# Patient Record
Sex: Male | Born: 1965 | Race: White | Hispanic: No | Marital: Single | Smoking: Former smoker
Health system: Southern US, Community
[De-identification: ages and names within clinical notes are randomized; demographics above are authoritative.]

## PROBLEM LIST (undated history)

## (undated) DIAGNOSIS — M51369 Other intervertebral disc degeneration, lumbar region without mention of lumbar back pain or lower extremity pain: Secondary | ICD-10-CM

## (undated) DIAGNOSIS — M5136 Other intervertebral disc degeneration, lumbar region: Secondary | ICD-10-CM

## (undated) DIAGNOSIS — M25512 Pain in left shoulder: Secondary | ICD-10-CM

## (undated) HISTORY — PX: TONSILLECTOMY: SUR1361

---

## 2014-07-28 ENCOUNTER — Ambulatory Visit: Payer: Self-pay | Admitting: General Practice

## 2016-02-13 ENCOUNTER — Emergency Department
Admission: EM | Admit: 2016-02-13 | Discharge: 2016-02-13 | Disposition: A | Discharge status: Home / Self Care | Payer: BLUE CROSS/BLUE SHIELD | Attending: Student | Admitting: Student

## 2016-02-13 ENCOUNTER — Emergency Department: Payer: BLUE CROSS/BLUE SHIELD

## 2016-02-13 DIAGNOSIS — M25512 Pain in left shoulder: Secondary | ICD-10-CM | POA: Diagnosis not present

## 2016-02-13 DIAGNOSIS — R079 Chest pain, unspecified: Secondary | ICD-10-CM | POA: Diagnosis present

## 2016-02-13 DIAGNOSIS — M25511 Pain in right shoulder: Secondary | ICD-10-CM | POA: Diagnosis not present

## 2016-02-13 LAB — COMPREHENSIVE METABOLIC PANEL
ALBUMIN: 4.4 g/dL (ref 3.5–5.0)
ALT: 44 U/L (ref 17–63)
AST: 34 U/L (ref 15–41)
Alkaline Phosphatase: 67 U/L (ref 38–126)
Anion gap: 6 (ref 5–15)
BILIRUBIN TOTAL: 0.7 mg/dL (ref 0.3–1.2)
BUN: 12 mg/dL (ref 6–20)
CO2: 29 mmol/L (ref 22–32)
CREATININE: 0.96 mg/dL (ref 0.61–1.24)
Calcium: 9.2 mg/dL (ref 8.9–10.3)
Chloride: 105 mmol/L (ref 101–111)
GFR calc Af Amer: >60 mL/min (ref >60)
GLUCOSE: 166 mg/dL — AB (ref 65–99)
POTASSIUM: 3.8 mmol/L (ref 3.5–5.1)
Sodium: 140 mmol/L (ref 135–145)
TOTAL PROTEIN: 7.7 g/dL (ref 6.5–8.1)

## 2016-02-13 LAB — CBC
HEMATOCRIT: 45.2 % (ref 40.0–52.0)
HEMOGLOBIN: 15.8 g/dL (ref 13.0–18.0)
MCH: 31.2 pg (ref 26.0–34.0)
MCHC: 34.9 g/dL (ref 32.0–36.0)
MCV: 89.5 fL (ref 80.0–100.0)
Platelets: 254 10*3/uL (ref 150–440)
RBC: 5.05 MIL/uL (ref 4.40–5.90)
RDW: 13.4 % (ref 11.5–14.5)
WBC: 8.3 10*3/uL (ref 3.8–10.6)

## 2016-02-13 LAB — TROPONIN I

## 2016-02-13 MED ORDER — ASPIRIN 81 MG PO CHEW
324.0000 mg | CHEWABLE_TABLET | Freq: Once | ORAL | Status: AC
  Administered 2016-02-13: 324 mg via ORAL
  Filled 2016-02-13: qty 4

## 2016-02-13 MED ORDER — IBUPROFEN 600 MG PO TABS: 600.0000 mg | ORAL_TABLET | Freq: Four times a day (QID) | ORAL | Status: DC | PRN

## 2016-02-13 NOTE — ED Notes (Signed)
Pt arrived to ED with c/o CP that is mid sternal that is described as burning. Pt also reports pain radiates into left arm. Pt reports left arm is tingling. Pt reports "burping makes it feel better".

## 2016-02-13 NOTE — ED Provider Notes (Signed)
[facility] Emergency Department Provider Note  ____________________________________________  Time seen: Approximately 9:16 PM  I have reviewed the triage vital signs and the nursing notes.   HISTORY  Chief Complaint Chest Pain    HPI Raymond Glenn is a 50 y.o. male with no chronic medical problems who presents with constant burning sternal chest pain today since 3 pm, gradual onset, constant since onset, moderate and worse with movement. Pain began 30 mins after eating pizza. No nausea, vomiting, diarrhea, fevers, or chills. Pain not pleuritic in nature. No history of coronary artery disease. He is also having pain in bilateral shoulders but reports that he moved a heavy tree 3 days ago. No personal or family history of PE or DVT, no exogenous estrogen, hemoptysis, recent surgeries. He reports that his father had a heart attack in his 106s.   History reviewed. No pertinent past medical history.  There are no active problems to display for this patient.   History reviewed. No pertinent past surgical history.  Current Outpatient Rx  Name  Route  Sig  Dispense  Refill  . ibuprofen (ADVIL,MOTRIN) 600 MG tablet   Oral   Take 1 tablet (600 mg total) by mouth every 6 (six) hours as needed for moderate pain.   15 tablet   0     Allergies Sulfa antibiotics  History reviewed. No pertinent family history.  Social History Social History  Substance Use Topics  . Smoking status: Never Smoker   . Smokeless tobacco: None  . Alcohol Use: No    Review of Systems Constitutional: No fever/chills Eyes: No visual changes. ENT: No sore throat. Cardiovascular:chest pain. Respiratory: Denies shortness of breath. Gastrointestinal: No abdominal pain.  No nausea, no vomiting.  No diarrhea.  No constipation. Genitourinary: Negative for dysuria. Musculoskeletal: Negative for back pain. Skin: Negative for rash. Neurological: Negative for headaches, focal weakness or  numbness.  10-point ROS otherwise negative.  ____________________________________________   PHYSICAL EXAM:  VITAL SIGNS: ED Triage Vitals  Enc Vitals Group     BP 02/13/16 2030 142/87 mmHg     Pulse Rate 02/13/16 2030 91     Resp 02/13/16 2030 20     Temp 02/13/16 2030 98.2 F (36.8 C)     Temp Source 02/13/16 2030 Oral     SpO2 02/13/16 2030 98 %     Weight 02/13/16 2030 224 lb (101.606 kg)     Height 02/13/16 2030  (1.905 m)     Head Cir --      Peak Flow --      Pain Score 02/13/16 2038 4     Pain Loc --      Pain Edu? --      Excl. in GC? --     Constitutional: Alert and oriented. Well appearing and in no acute distress. Eyes: Conjunctivae are normal. PERRL. EOMI. Head: Atraumatic. Nose: No congestion/rhinnorhea. Mouth/Throat: Mucous membranes are moist.  Oropharynx non-erythematous. Neck: No stridor.   Cardiovascular: Normal rate, regular rhythm. Grossly normal heart sounds.  Good peripheral circulation. Respiratory: Normal respiratory effort.  No retractions. Lungs CTAB. Gastrointestinal: Soft and nontender. No distention.  No CVA tenderness. Genitourinary: deferred Musculoskeletal: No lower extremity tenderness nor edema.  No joint effusions. Gaze would've the right pectoralis muscles worsens the patient's pain, rotation of the torso to the left also worsens his pain. Neurologic:  Normal speech and language. No gross focal neurologic deficits are appreciated. No gait instability. Skin:  Skin is warm, dry and  intact. No rash noted. Psychiatric: Mood and affect are normal. Speech and behavior are normal.  ____________________________________________   LABS (all labs ordered are listed, but only abnormal results are displayed)  Labs Reviewed  COMPREHENSIVE METABOLIC PANEL - Abnormal; Notable for the following:    Glucose, Bld 166 (*)    All other components within normal limits  CBC  TROPONIN I    ____________________________________________  EKG  ED ECG REPORT I, Gayla Doss, the attending physician, personally viewed and interpreted this ECG.   Date: 02/13/2016  EKG Time: 20:28  Rate: 90  Rhythm: normal sinus rhythm  Axis: normal  Intervals:none  ST&T Change: No acute ST elevation or ST depression.  ____________________________________________  RADIOLOGY  CXR IMPRESSION: No active cardiopulmonary disease.  ____________________________________________   PROCEDURES  Procedure(s) performed: None  Critical Care performed: No  ____________________________________________   INITIAL IMPRESSION / ASSESSMENT AND PLAN / ED COURSE  Pertinent labs & imaging results that were available during my care of the patient were reviewed by me and considered in my medical decision making (see chart for details).  Raymond Glenn is a 50 y.o. male with no chronic medical problems who presents with constant burning sternal chest pain today since 3 pm. On exam, he is very well-appearing and in no acute distress. Vital signs are stable, he is afebrile. EKG reassuring, troponin negative. Heart score 3, low risk for ACS. PERC negative and I doubt PE. Not consistent with acute aortic dissection. Chest x-ray clear. CBC and CMP unremarkable. Suspect muscular skeletal chest pain given that it worsens significantly with movement. Additionally he believes he may be having some symptoms of GERD and I suspect this may be likely as his symptoms started after eating pizza today. We discussed return precautions, treatment with ibuprofen, need for close PCP follow-up and he is comfortable with the discharge plan. DC home. Aspirin given. ____________________________________________   FINAL CLINICAL IMPRESSION(S) / ED DIAGNOSES  Final diagnoses:  Chest pain, unspecified chest pain type      Gayla Doss, MD 02/13/16 2313

## 2016-07-10 ENCOUNTER — Other Ambulatory Visit: Payer: Self-pay | Admitting: Surgery

## 2016-07-10 DIAGNOSIS — M25562 Pain in left knee: Secondary | ICD-10-CM

## 2016-07-10 DIAGNOSIS — S83232A Complex tear of medial meniscus, current injury, left knee, initial encounter: Secondary | ICD-10-CM

## 2016-07-30 ENCOUNTER — Ambulatory Visit
Admission: RE | Admit: 2016-07-30 | Discharge: 2016-07-30 | Disposition: A | Discharge status: Home / Self Care | Payer: BLUE CROSS/BLUE SHIELD | Source: Ambulatory Visit | Attending: Surgery | Admitting: Surgery

## 2016-07-30 DIAGNOSIS — M23222 Derangement of posterior horn of medial meniscus due to old tear or injury, left knee: Secondary | ICD-10-CM | POA: Insufficient documentation

## 2016-07-30 DIAGNOSIS — S83232A Complex tear of medial meniscus, current injury, left knee, initial encounter: Secondary | ICD-10-CM

## 2016-07-30 DIAGNOSIS — M25562 Pain in left knee: Secondary | ICD-10-CM

## 2016-08-15 ENCOUNTER — Encounter: Payer: Self-pay | Admitting: *Deleted

## 2016-08-21 ENCOUNTER — Ambulatory Visit: Payer: BLUE CROSS/BLUE SHIELD | Admitting: Anesthesiology

## 2016-08-21 ENCOUNTER — Ambulatory Visit
Admission: RE | Admit: 2016-08-21 | Discharge: 2016-08-21 | Disposition: A | Discharge status: Home / Self Care | Payer: BLUE CROSS/BLUE SHIELD | Source: Ambulatory Visit | Attending: Surgery | Admitting: Surgery

## 2016-08-21 ENCOUNTER — Encounter
Admission: RE | Disposition: A | Discharge status: Home / Self Care | Payer: Self-pay | Source: Ambulatory Visit | Attending: Surgery

## 2016-08-21 DIAGNOSIS — Z87891 Personal history of nicotine dependence: Secondary | ICD-10-CM | POA: Diagnosis not present

## 2016-08-21 DIAGNOSIS — X501XXA Overexertion from prolonged static or awkward postures, initial encounter: Secondary | ICD-10-CM | POA: Insufficient documentation

## 2016-08-21 DIAGNOSIS — K219 Gastro-esophageal reflux disease without esophagitis: Secondary | ICD-10-CM | POA: Insufficient documentation

## 2016-08-21 DIAGNOSIS — S83241A Other tear of medial meniscus, current injury, right knee, initial encounter: Secondary | ICD-10-CM | POA: Insufficient documentation

## 2016-08-21 DIAGNOSIS — S83242A Other tear of medial meniscus, current injury, left knee, initial encounter: Secondary | ICD-10-CM | POA: Diagnosis present

## 2016-08-21 HISTORY — DX: Other intervertebral disc degeneration, lumbar region without mention of lumbar back pain or lower extremity pain: M51.369

## 2016-08-21 HISTORY — DX: Other intervertebral disc degeneration, lumbar region: M51.36

## 2016-08-21 HISTORY — DX: Pain in left shoulder: M25.512

## 2016-08-21 HISTORY — PX: KNEE ARTHROSCOPY: SHX127

## 2016-08-21 SURGERY — ARTHROSCOPY, KNEE
Anesthesia: General | Laterality: Left | Wound class: Clean

## 2016-08-21 MED ORDER — DEXAMETHASONE SODIUM PHOSPHATE 4 MG/ML IJ SOLN
INTRAMUSCULAR | Status: DC | PRN
  Administered 2016-08-21: 4 mg via INTRAVENOUS

## 2016-08-21 MED ORDER — MIDAZOLAM HCL 5 MG/5ML IJ SOLN
INTRAMUSCULAR | Status: DC | PRN
  Administered 2016-08-21: 2 mg via INTRAVENOUS

## 2016-08-21 MED ORDER — LACTATED RINGERS IV SOLN
INTRAVENOUS | Status: DC
  Administered 2016-08-21: 13:00:00 via INTRAVENOUS

## 2016-08-21 MED ORDER — GLYCOPYRROLATE 0.2 MG/ML IJ SOLN
INTRAMUSCULAR | Status: DC | PRN
  Administered 2016-08-21: 0.1 mg via INTRAVENOUS

## 2016-08-21 MED ORDER — ONDANSETRON HCL 4 MG/2ML IJ SOLN: 4.0000 mg | Freq: Four times a day (QID) | INTRAMUSCULAR | Status: DC | PRN

## 2016-08-21 MED ORDER — HYDROCODONE-ACETAMINOPHEN 5-325 MG PO TABS: 1.0000 | ORAL_TABLET | Freq: Four times a day (QID) | ORAL | 0 refills | Status: AC | PRN

## 2016-08-21 MED ORDER — LIDOCAINE HCL (CARDIAC) 20 MG/ML IV SOLN
INTRAVENOUS | Status: DC | PRN
  Administered 2016-08-21: 50 mg via INTRATRACHEAL

## 2016-08-21 MED ORDER — PROPOFOL 10 MG/ML IV BOLUS
INTRAVENOUS | Status: DC | PRN
  Administered 2016-08-21: 200 mg via INTRAVENOUS

## 2016-08-21 MED ORDER — ONDANSETRON HCL 4 MG/2ML IJ SOLN
INTRAMUSCULAR | Status: DC | PRN
  Administered 2016-08-21: 4 mg via INTRAVENOUS

## 2016-08-21 MED ORDER — CEFAZOLIN SODIUM-DEXTROSE 2-4 GM/100ML-% IV SOLN
2.0000 g | Freq: Once | INTRAVENOUS | Status: AC
  Administered 2016-08-21: 2 g via INTRAVENOUS

## 2016-08-21 MED ORDER — LIDOCAINE HCL 1 % IJ SOLN
INTRAMUSCULAR | Status: DC | PRN
  Administered 2016-08-21: 60 mL via INTRAMUSCULAR

## 2016-08-21 MED ORDER — POTASSIUM CHLORIDE IN NACL 20-0.9 MEQ/L-% IV SOLN: INTRAVENOUS | Status: DC

## 2016-08-21 MED ORDER — OXYCODONE HCL 5 MG/5ML PO SOLN: 5.0000 mg | Freq: Once | ORAL | Status: DC | PRN

## 2016-08-21 MED ORDER — METOCLOPRAMIDE HCL 5 MG/ML IJ SOLN: 5.0000 mg | Freq: Three times a day (TID) | INTRAMUSCULAR | Status: DC | PRN

## 2016-08-21 MED ORDER — ONDANSETRON HCL 4 MG PO TABS: 4.0000 mg | ORAL_TABLET | Freq: Four times a day (QID) | ORAL | Status: DC | PRN

## 2016-08-21 MED ORDER — HYDROCODONE-ACETAMINOPHEN 5-325 MG PO TABS: 1.0000 | ORAL_TABLET | ORAL | Status: DC | PRN

## 2016-08-21 MED ORDER — FENTANYL CITRATE (PF) 100 MCG/2ML IJ SOLN
INTRAMUSCULAR | Status: DC | PRN
  Administered 2016-08-21: 100 ug via INTRAVENOUS

## 2016-08-21 MED ORDER — METOCLOPRAMIDE HCL 5 MG PO TABS: 5.0000 mg | ORAL_TABLET | Freq: Three times a day (TID) | ORAL | Status: DC | PRN

## 2016-08-21 MED ORDER — BUPIVACAINE-EPINEPHRINE (PF) 0.5% -1:200000 IJ SOLN
INTRAMUSCULAR | Status: DC | PRN
  Administered 2016-08-21: 20 mL

## 2016-08-21 MED ORDER — OXYCODONE HCL 5 MG PO TABS: 5.0000 mg | ORAL_TABLET | Freq: Once | ORAL | Status: DC | PRN

## 2016-08-21 SURGICAL SUPPLY — 30 items
BANDAGE ELASTIC 6 VELCRO NS (GAUZE/BANDAGES/DRESSINGS) ×3 IMPLANT
BLADE FULL RADIUS 3.5 (BLADE) ×3 IMPLANT
BUR ACROMIONIZER 4.0 (BURR) IMPLANT
CHLORAPREP W/TINT 26ML (MISCELLANEOUS) ×3 IMPLANT
COVER LIGHT HANDLE UNIVERSAL (MISCELLANEOUS) ×6 IMPLANT
CUFF TOURN SGL QUICK 30 (MISCELLANEOUS) ×2
CUFF TOURN SGL QUICK 34 (TOURNIQUET CUFF)
CUFF TRNQT CYL 34X4X40X1 (TOURNIQUET CUFF) IMPLANT
CUFF TRNQT CYL LO 30X4X (MISCELLANEOUS) ×1 IMPLANT
DRAPE IMP U-DRAPE 54X76 (DRAPES) ×3 IMPLANT
GAUZE SPONGE 4X4 12PLY STRL (GAUZE/BANDAGES/DRESSINGS) ×3 IMPLANT
GLOVE BIO SURGEON STRL SZ8 (GLOVE) ×6 IMPLANT
GLOVE INDICATOR 8.0 STRL GRN (GLOVE) ×3 IMPLANT
GOWN STRL REUS W/ TWL LRG LVL3 (GOWN DISPOSABLE) ×1 IMPLANT
GOWN STRL REUS W/ TWL XL LVL3 (GOWN DISPOSABLE) ×1 IMPLANT
GOWN STRL REUS W/TWL LRG LVL3 (GOWN DISPOSABLE) ×2
GOWN STRL REUS W/TWL XL LVL3 (GOWN DISPOSABLE) ×2
IV LACTATED RINGER IRRG 3000ML (IV SOLUTION) ×2
IV LR IRRIG 3000ML ARTHROMATIC (IV SOLUTION) ×1 IMPLANT
KIT ROOM TURNOVER OR (KITS) ×3 IMPLANT
MANIFOLD 4PT FOR NEPTUNE1 (MISCELLANEOUS) ×3 IMPLANT
NEEDLE HYPO 21X1.5 SAFETY (NEEDLE) ×6 IMPLANT
PACK ARTHROSCOPY KNEE (MISCELLANEOUS) ×3 IMPLANT
STRAP BODY AND KNEE 60X3 (MISCELLANEOUS) ×3 IMPLANT
SUT PROLENE 4 0 PS 2 18 (SUTURE) ×3 IMPLANT
SUT VIC AB 2-0 CT1 27 (SUTURE)
SUT VIC AB 2-0 CT1 TAPERPNT 27 (SUTURE) IMPLANT
SYR 50ML LL SCALE MARK (SYRINGE) ×3 IMPLANT
TUBING ARTHRO INFLOW-ONLY STRL (TUBING) ×3 IMPLANT
WAND HAND CNTRL MULTIVAC 90 (MISCELLANEOUS) IMPLANT

## 2016-08-21 NOTE — Anesthesia Preprocedure Evaluation (Signed)
Anesthesia Evaluation  Patient identified by MRN, date of birth, ID band  Reviewed: NPO status   History of Anesthesia Complications Negative for: history of anesthetic complications  Airway Mallampati: II  TM Distance: >3 FB Neck ROM: full   Comment: +beard Dental no notable dental hx.    Pulmonary neg pulmonary ROS, former smoker,    Pulmonary exam normal        Cardiovascular Exercise Tolerance: Good negative cardio ROS Normal cardiovascular exam     Neuro/Psych negative neurological ROS  negative psych ROS   GI/Hepatic Neg liver ROS, GERD  Controlled,  Endo/Other  negative endocrine ROS  Renal/GU negative Renal ROS  negative genitourinary   Musculoskeletal   Abdominal   Peds  Hematology negative hematology ROS (+)   Anesthesia Other Findings   Reproductive/Obstetrics                             Anesthesia Physical Anesthesia Plan  ASA: II  Anesthesia Plan: General   Post-op Pain Management:    Induction:   Airway Management Planned:   Additional Equipment:   Intra-op Plan:   Post-operative Plan:   Informed Consent: I have reviewed the patients History and Physical, chart, labs and discussed the procedure including the risks, benefits and alternatives for the proposed anesthesia with the patient or authorized representative who has indicated his/her understanding and acceptance.     Plan Discussed with: CRNA  Anesthesia Plan Comments:         Anesthesia Quick Evaluation

## 2016-08-21 NOTE — H&P (Signed)
Paper H&P to be scanned into permanent record. H&P reviewed. No changes. 

## 2016-08-21 NOTE — Transfer of Care (Signed)
Immediate Anesthesia Transfer of Care Note  Patient: Raymond Glenn  Procedure(s) Performed: Procedure(s): ARTHROSCOPY KNEE WITH DEBRIDMENT AND PARTIAL MEDIAL MENISCECTOMY (Left)  Patient Location: PACU  Anesthesia Type: General  Level of Consciousness: awake, alert  and patient cooperative  Airway and Oxygen Therapy: Patient Spontanous Breathing and Patient connected to supplemental oxygen  Post-op Assessment: Post-op Vital signs reviewed, Patient's Cardiovascular Status Stable, Respiratory Function Stable, Patent Airway and No signs of Nausea or vomiting  Post-op Vital Signs: Reviewed and stable  Complications: No apparent anesthesia complications

## 2016-08-21 NOTE — Discharge Instructions (Signed)
General Anesthesia, Adult, Care After Refer to this sheet in the next few weeks. These instructions provide you with information on caring for yourself after your procedure. Your health care provider may also give you more specific instructions. Your treatment has been planned according to current medical practices, but problems sometimes occur. Call your health care provider if you have any problems or questions after your procedure. WHAT TO EXPECT AFTER THE PROCEDURE After the procedure, it is typical to experience:  Sleepiness.  Nausea and vomiting. HOME CARE INSTRUCTIONS  For the first 24 hours after general anesthesia:  Have a responsible person with you.  Do not drive a car. If you are alone, do not take public transportation.  Do not drink alcohol.  Do not take medicine that has not been prescribed by your health care provider.  Do not sign important papers or make important decisions.  You may resume a normal diet and activities as directed by your health care provider.  Change bandages (dressings) as directed.  If you have questions or problems that seem related to general anesthesia, call the hospital and ask for the anesthetist or anesthesiologist on call. SEEK MEDICAL CARE IF:  You have nausea and vomiting that continue the day after anesthesia.  You develop a rash. SEEK IMMEDIATE MEDICAL CARE IF:   You have difficulty breathing.  You have chest pain.  You have any allergic problems.   This information is not intended to replace advice given to you by your health care provider. Make sure you discuss any questions you have with your health care provider.   Document Released: 03/17/2001 Document Revised: 12/30/2014 Document Reviewed: 04/08/2012 Elsevier Interactive Patient Education 2016 [employer]  Keep dressing dry and intact.  May shower after dressing changed on post-op day #4 (Sunday).  Cover sutures with Band-Aids after drying off. Apply ice  frequently to knee. Take ibuprofen 800 mg TID with meals for 7-10 days, then as necessary. Take pain medication as prescribed or ES Tylenol when needed.  May weight-bear as tolerated - use crutches as needed. Follow-up in 10-14 days or as scheduled.

## 2016-08-21 NOTE — Anesthesia Procedure Notes (Signed)
Procedure Name: LMA Insertion Date/Time: 08/21/2016 2:58 PM Performed by: Jimmy PicketAMYOT, Davonne Jarnigan Pre-anesthesia Checklist: Patient identified, Emergency Drugs available, Suction available, Timeout performed and Patient being monitored Patient Re-evaluated:Patient Re-evaluated prior to inductionOxygen Delivery Method: Circle system utilized Preoxygenation: Pre-oxygenation with 100% oxygen Intubation Type: IV induction LMA: LMA inserted LMA Size: 4.0 Number of attempts: 1 Placement Confirmation: positive ETCO2 and breath sounds checked- equal and bilateral Tube secured with: Tape

## 2016-08-21 NOTE — Op Note (Signed)
08/21/2016  3:43 PM  Patient:   Raymond PurserJames Mellor  Pre-Op Diagnosis:   Medial meniscus tear, left knee.  Postoperative diagnosis:   Same.  Procedure:   Arthroscopic partial medial meniscectomy with debridement, left knee.  Surgeon:   Maryagnes AmosJ. Jeffrey Poggi, M.D.  Anesthesia:   General LMA.  Findings:   As above. The lateral meniscus was in excellent condition, as were the anterior and posterior cruciate ligament. The articular surfaces of the patella, the femur, and the tibia all were in satisfactory condition.  Complications:   None.  EBL:   <5 cc.  Total fluids:   800 cc of crystalloid.  Tourniquet time:   None  Drains:   None  Closure:   4-0 Prolene interrupted sutures.  Brief clinical note:   The patient is a 50 year old male with a several month history of medial sided left knee pain. His symptoms have persisted despite medications, activity modification, etc. His history and examination are consistent with a medial meniscus tear which was confirmed by MRI scan. The patient presents at this time for arthroscopy, debridement, and partial medial meniscectomy.  Procedure:   The patient was brought into the operating room and lain in the supine position. After adequate general laryngal mask anesthesia was obtained, a timeout was performed to verify the appropriate side. The patient's left knee was injected sterilely using a solution of 30 cc of 1% lidocaine and 30 cc of 0.5% Sensorcaine with epinephrine. The left lower extremity was prepped with ChloraPrep solution before being draped sterilely. Preoperative antibiotics were administered. The expected portal sites were injected with 0.5% Sensorcaine with epinephrine before the camera was placed in the anterolateral portal and instrumentation performed through the anteromedial portal. The knee was sequentially examined beginning in the suprapatellar pouch, then progressing to the patellofemoral space, the medial gutter compartment, the notch,  and finally the lateral compartment and gutter. The findings were as described above. Abundant reactive synovial tissues anteriorly were debrided using the full-radius resector in order to improve visualization. Unstable flap component of the medial meniscus tear was debrided back to stable margins using combination of the straight and side-biting baskets, as well as the full-radius resector. Subsequent probing of the repair demonstrated excellent stability. The instruments were removed from the joint after suctioning the excess fluid. The portal sites were closed using 4-0 Prolene interrupted sutures before a sterile bulky dressing was applied to the knee. The patient was then awakened, extubated, and returned to the recovery room in satisfactory condition after tolerating the procedure well.

## 2016-08-21 NOTE — Anesthesia Postprocedure Evaluation (Signed)
Anesthesia Post Note  Patient: Laymond PurserJames Parrella  Procedure(s) Performed: Procedure(s) (LRB): ARTHROSCOPY KNEE WITH DEBRIDMENT AND PARTIAL MEDIAL MENISCECTOMY (Left)  Patient location during evaluation: PACU Anesthesia Type: General Level of consciousness: awake and alert Pain management: pain level controlled Vital Signs Assessment: post-procedure vital signs reviewed and stable Respiratory status: spontaneous breathing, nonlabored ventilation, respiratory function stable and patient connected to nasal cannula oxygen Cardiovascular status: blood pressure returned to baseline and stable Postop Assessment: no signs of nausea or vomiting Anesthetic complications: no    Haidyn Chadderdon

## 2016-08-22 ENCOUNTER — Encounter: Payer: Self-pay | Admitting: Surgery

## 2017-05-28 ENCOUNTER — Encounter: Payer: Self-pay | Admitting: Emergency Medicine

## 2017-05-28 ENCOUNTER — Emergency Department: Payer: BLUE CROSS/BLUE SHIELD

## 2017-05-28 ENCOUNTER — Emergency Department
Admission: EM | Admit: 2017-05-28 | Discharge: 2017-05-28 | Disposition: A | Discharge status: Home / Self Care | Payer: BLUE CROSS/BLUE SHIELD | Attending: Emergency Medicine | Admitting: Emergency Medicine

## 2017-05-28 DIAGNOSIS — M5030 Other cervical disc degeneration, unspecified cervical region: Secondary | ICD-10-CM

## 2017-05-28 DIAGNOSIS — R202 Paresthesia of skin: Secondary | ICD-10-CM | POA: Diagnosis present

## 2017-05-28 DIAGNOSIS — Z87891 Personal history of nicotine dependence: Secondary | ICD-10-CM | POA: Insufficient documentation

## 2017-05-28 DIAGNOSIS — F419 Anxiety disorder, unspecified: Secondary | ICD-10-CM | POA: Insufficient documentation

## 2017-05-28 DIAGNOSIS — M79602 Pain in left arm: Secondary | ICD-10-CM | POA: Diagnosis not present

## 2017-05-28 LAB — CBC WITH DIFFERENTIAL/PLATELET
BASOS ABS: 0 10*3/uL (ref 0–0.1)
Basophils Relative: 1 %
EOS PCT: 2 %
Eosinophils Absolute: 0.1 10*3/uL (ref 0–0.7)
HCT: 47.1 % (ref 40.0–52.0)
Hemoglobin: 16.6 g/dL (ref 13.0–18.0)
LYMPHS ABS: 2.1 10*3/uL (ref 1.0–3.6)
LYMPHS PCT: 25 %
MCH: 32 pg (ref 26.0–34.0)
MCHC: 35.3 g/dL (ref 32.0–36.0)
MCV: 90.7 fL (ref 80.0–100.0)
MONO ABS: 0.8 10*3/uL (ref 0.2–1.0)
Monocytes Relative: 9 %
Neutro Abs: 5.2 10*3/uL (ref 1.4–6.5)
Neutrophils Relative %: 63 %
PLATELETS: 252 10*3/uL (ref 150–440)
RBC: 5.19 MIL/uL (ref 4.40–5.90)
RDW: 13.2 % (ref 11.5–14.5)
WBC: 8.2 10*3/uL (ref 3.8–10.6)

## 2017-05-28 LAB — BASIC METABOLIC PANEL
Anion gap: 9 (ref 5–15)
BUN: 9 mg/dL (ref 6–20)
CO2: 23 mmol/L (ref 22–32)
Calcium: 9.1 mg/dL (ref 8.9–10.3)
Chloride: 106 mmol/L (ref 101–111)
Creatinine, Ser: 1.02 mg/dL (ref 0.61–1.24)
GFR calc Af Amer: >60 mL/min (ref >60)
GLUCOSE: 115 mg/dL — AB (ref 65–99)
POTASSIUM: 3.2 mmol/L — AB (ref 3.5–5.1)
Sodium: 138 mmol/L (ref 135–145)

## 2017-05-28 LAB — TROPONIN I: Troponin I: <0.03 ng/mL (ref <0.03)

## 2017-05-28 MED ORDER — PREDNISONE 20 MG PO TABS
60.0000 mg | ORAL_TABLET | Freq: Once | ORAL | Status: AC
  Administered 2017-05-28: 60 mg via ORAL
  Filled 2017-05-28: qty 3

## 2017-05-28 MED ORDER — ALPRAZOLAM 0.5 MG PO TABS
1.0000 mg | ORAL_TABLET | Freq: Once | ORAL | Status: AC
  Administered 2017-05-28: 1 mg via ORAL
  Filled 2017-05-28: qty 2

## 2017-05-28 MED ORDER — PREDNISONE 10 MG (21) PO TBPK: ORAL_TABLET | ORAL | 0 refills | Status: AC

## 2017-05-28 NOTE — ED Notes (Signed)
ED Provider at bedside. 

## 2017-05-28 NOTE — ED Triage Notes (Addendum)
Pt presents to ED with intermittent arm numbness for the past several weeks. Pt seen by provider and was told cause was likely related to a shoulder injury resulting in a pinched nerve and anxiety. Pt states symptoms worsened today and is now having numbness to his feet as well. pt reports he is extremely anxious and it could be just that but he wants to make sure. Pt denies chest pain or sob. Pt states he is moving out of [place] next week and has been very stressed about that move.

## 2017-05-28 NOTE — ED Provider Notes (Signed)
[facility] Emergency Department Provider Note       Time seen: ----------------------------------------- 8:04 PM on 05/28/2017 -----------------------------------------     I have reviewed the triage vital signs and the nursing notes.   HISTORY   Chief Complaint Numbness    HPI Raymond Glenn is a 51 y.o. male who presents to the ED for intermittent arm numbness for the past several weeks. Patient was seen by his provider and was told to cause was likely related to a shoulder injury resulting in a page nerve and anxiety. Patient states his symptoms worsened today and is now having numbness to his feet as well. Patient reports he is very anxious, his main concern today is tingling in both feet. He reports a chronic history of disc and back problems. He has never had imaging of the cervical spine.   Past Medical History:  Diagnosis Date  . Degenerative disc disease, lumbar   . Shoulder pain, left     There are no active problems to display for this patient.   Past Surgical History:  Procedure Laterality Date  . KNEE ARTHROSCOPY Left 08/21/2016   Procedure: ARTHROSCOPY KNEE WITH DEBRIDMENT AND PARTIAL MEDIAL MENISCECTOMY;  Surgeon: Christena FlakeJohn J Poggi, MD;  [facility];  Service: Orthopedics;  Laterality: Left;  . TONSILLECTOMY      Allergies Milk-related compounds and Sulfa antibiotics  Social History Social History  Substance Use Topics  . Smoking status: Former Smoker    Quit date: 12/23/2012  . Smokeless tobacco: Never Used  . Alcohol use Yes     Comment: 1-2 beers every other week    Review of Systems Constitutional: Negative for fever. Cardiovascular: Negative for chest pain. Respiratory: Negative for shortness of breath. Gastrointestinal: Negative for abdominal pain, vomiting and diarrhea. Genitourinary: Negative for dysuria. Musculoskeletal: Positive for left shoulder pain Skin: Negative for rash. Neurological:  Negative for headaches,Positive for paresthesias and tingling in the hands and feet  All systems negative/normal/unremarkable except as stated in the HPI  ____________________________________________   PHYSICAL EXAM:  VITAL SIGNS: ED Triage Vitals  Enc Vitals Group     BP 05/28/17 1907 139/90     Pulse Rate 05/28/17 1907 98     Resp --      Temp 05/28/17 1907 97.8 F (36.6 C)     Temp Source 05/28/17 1907 Oral     SpO2 05/28/17 1907 96 %     Weight 05/28/17 1912 214 lb (97.1 kg)     Height 05/28/17 1912 6\' 3"  (1.905 m)     Head Circumference --      Peak Flow --      Pain Score --      Pain Loc --      Pain Edu? --      Excl. in GC? --     Constitutional: Alert and oriented. Anxious, no distress Eyes: Conjunctivae are normal. Normal extraocular movements. ENT   Head: Normocephalic and atraumatic.   Nose: No congestion/rhinnorhea.   Mouth/Throat: Mucous membranes are moist.   Neck: No stridor. Cardiovascular: Normal rate, regular rhythm. No murmurs, rubs, or gallops. Respiratory: Normal respiratory effort without tachypnea nor retractions. Breath sounds are clear and equal bilaterally. No wheezes/rales/rhonchi. Musculoskeletal: Nontender with normal range of motion in extremities. No lower extremity tenderness nor edema. Neurologic:  Normal speech and language. Paresthesias are appreciated in the left arm, mostly in C8 distribution Skin:  Skin is warm, dry and intact. No rash noted. Psychiatric: Mood and affect  are normal. Speech and behavior are normal.  ___________________________________________  ED COURSE:  Pertinent labs & imaging results that were available during my care of the patient were reviewed by me and considered in my medical decision making (see chart for details). Patient presents for paresthesias, we will assess with labs and imaging as indicated.   Procedures ____________________________________________   LABS (pertinent  positives/negatives)  Labs Reviewed  BASIC METABOLIC PANEL - Abnormal; Notable for the following:       Result Value   Potassium 3.2 (*)    Glucose, Bld 115 (*)    All other components within normal limits  CBC WITH DIFFERENTIAL/PLATELET  TROPONIN I  URINALYSIS, ROUTINE W REFLEX MICROSCOPIC  ETHANOL    RADIOLOGY Images were viewed by me  Cervical spine series IMPRESSION: 1. Moderate degenerative disc space narrowing at C6-7 with osteophytes. 2. 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 3. No acute fracture, primary bone lesion nor bone destruction.   ____________________________________________  FINAL ASSESSMENT AND PLAN  Paresthesias,Degenerative disc disease, anxiety  Plan: Patient's labs and imaging were dictated above. Patient had presented for paresthesias and symptoms of anxiety. He does have likely significant degenerative disc disease and I may try him on a steroid taper. He will also be encouraged to take his previously prescribed Xanax. He is stable for outpatient follow-up at this time.   Emily Filbert, MD   Note: This note was generated in part or whole with voice recognition software. Voice recognition is usually quite accurate but there are transcription errors that can and very often do occur. I apologize for any typographical errors that were not detected and corrected.     Emily Filbert, MD 05/28/17 2050

## 2017-08-25 IMAGING — MR MR KNEE*L* W/O CM
6 series · 36 of 40 positions shown · non-contrast
Comparison: None.

CLINICAL DATA: Chronic intermittent left knee pain.  Recent strain.

EXAM:
MRI OF THE LEFT KNEE WITHOUT CONTRAST
TECHNIQUE: Multiplanar, multisequence MR imaging of the knee was performed. No
intravenous contrast was administered.

[Series 3: PD fat-sat · axial · 3.0mm · 0.50mm/px · z∈[-66,+60]mm · 7 of 39 slices shown (1 of 4)]
[im 1/39]
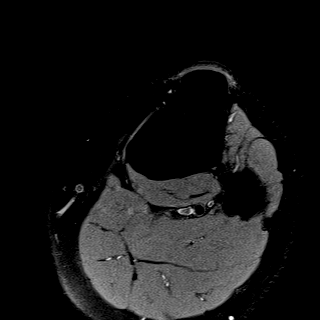
[im 7/39]
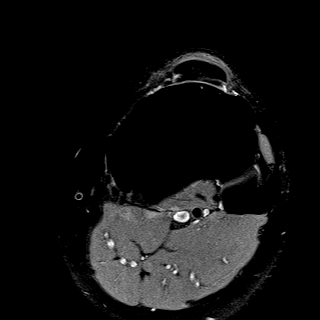
[im 13/39]
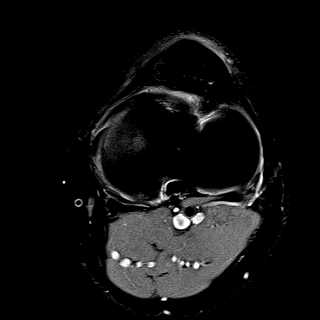
[im 20/39]
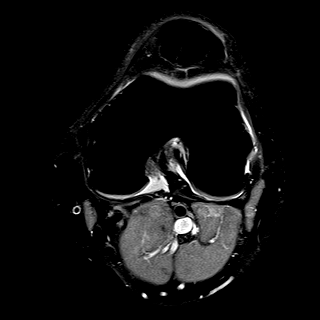
[im 26/39]
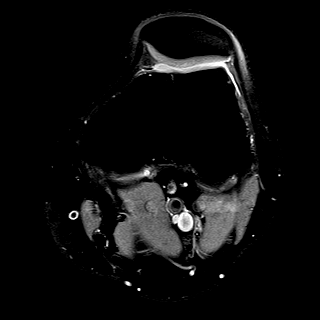
[im 32/39]
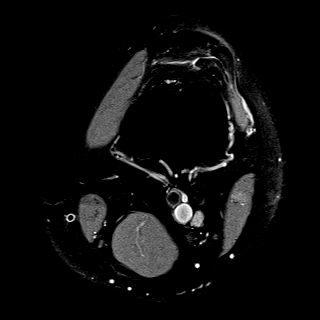
[im 39/39]
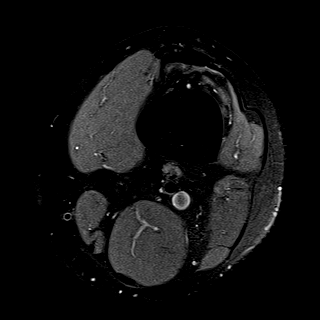

[Series 4: T1 · coronal · 3.0mm · 0.50mm/px · 3 of 37 slices shown]
[im 1/37]
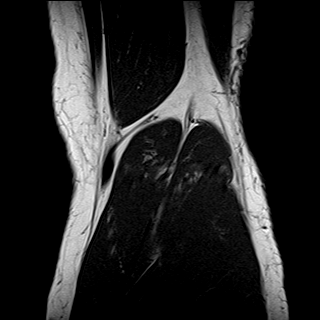
[im 7/37]
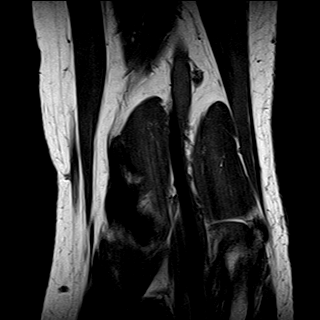
[im 13/37]
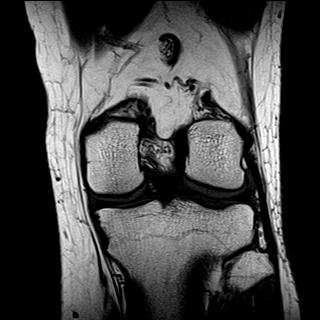

[Series 5: T2 fat-sat · coronal · 3.0mm · 0.31mm/px · 8 of 37 slices shown]
[im 1/37]
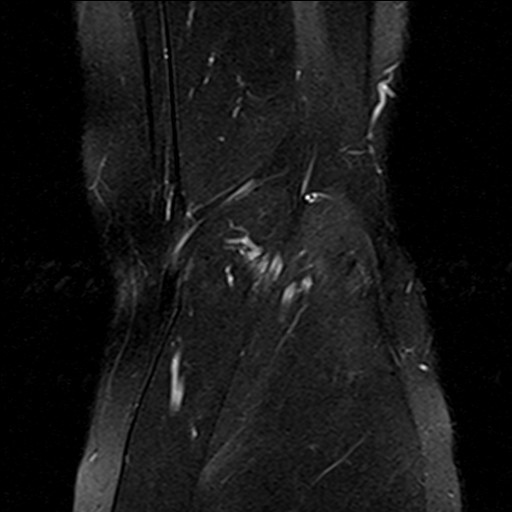
[im 6/37]
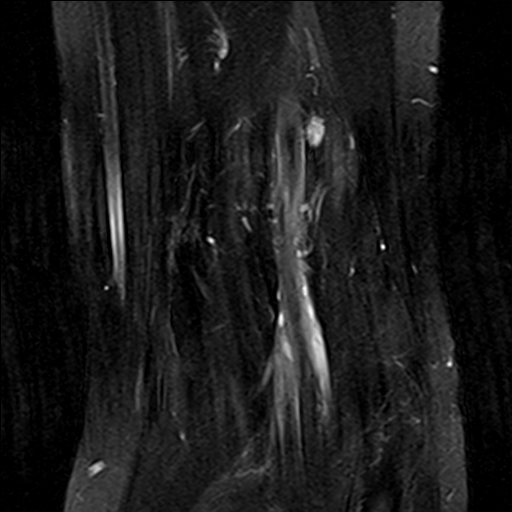
[im 11/37]
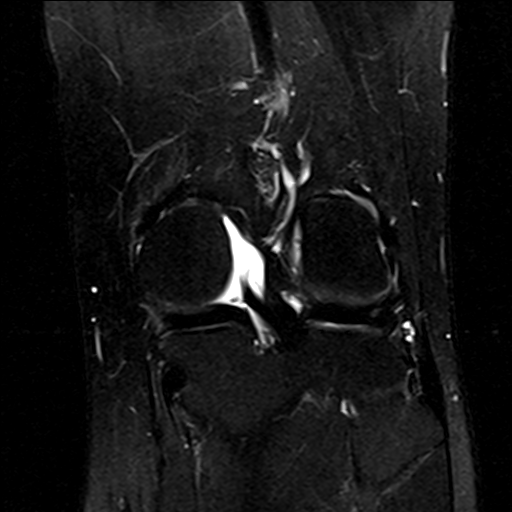
[im 16/37]
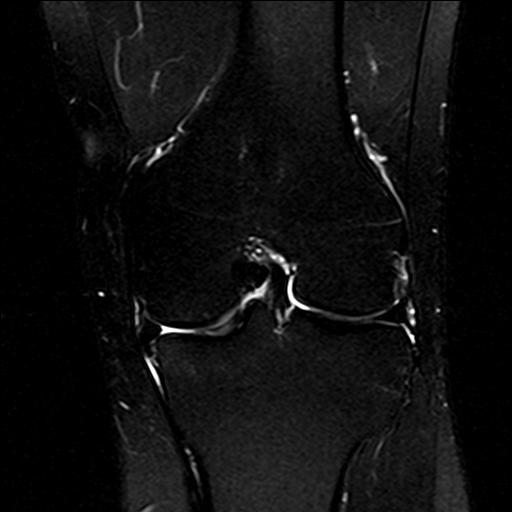
[im 21/37]
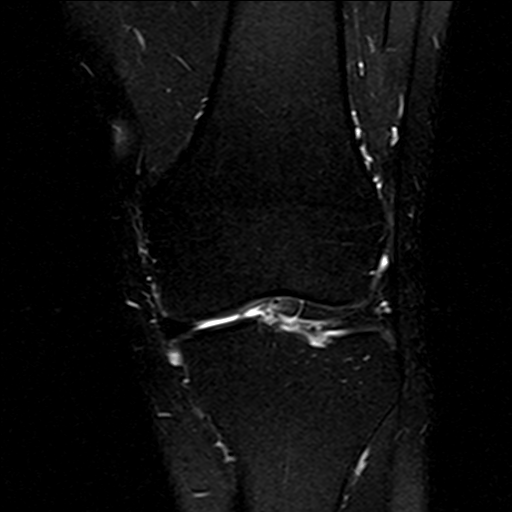
[im 26/37]
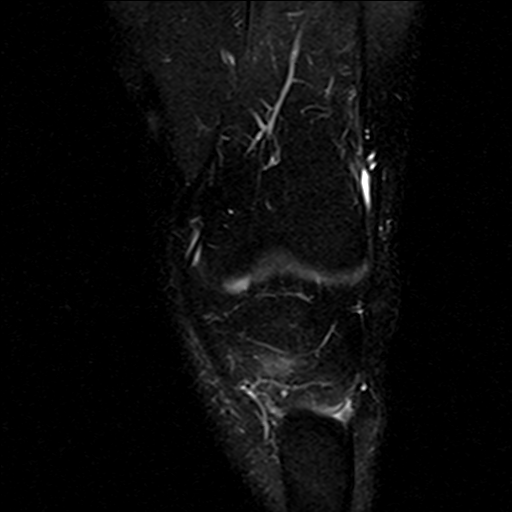
[im 31/37]
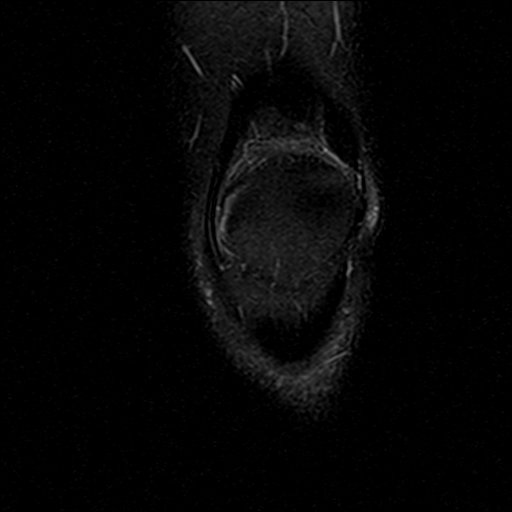
[im 37/37]
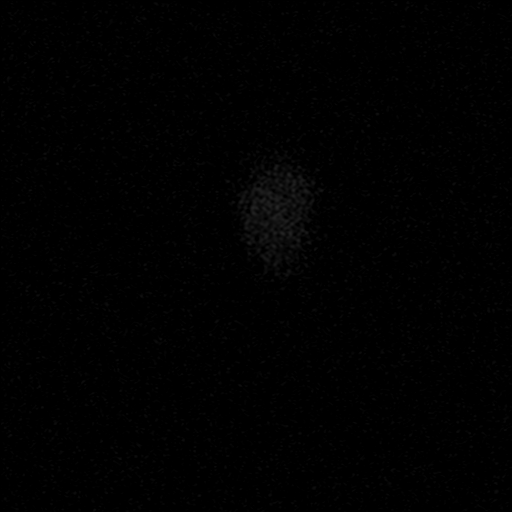

[Series 6: PD fat-sat · coronal · 3.0mm · 0.50mm/px · 8 of 37 slices shown (2 of 4)]
[im 1/37]
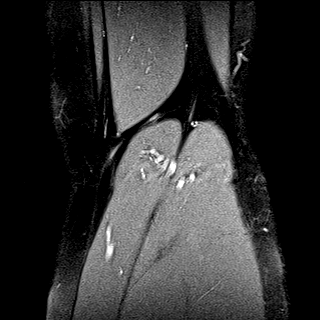
[im 6/37]
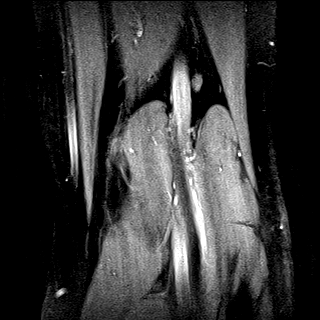
[im 11/37]
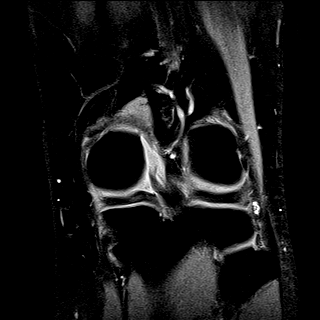
[im 16/37]
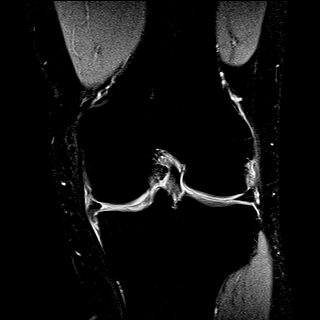
[im 21/37]
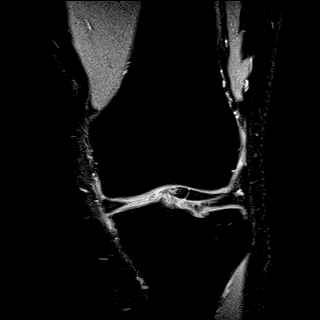
[im 26/37]
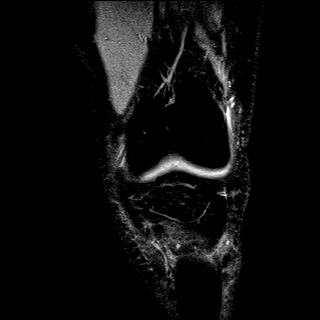
[im 31/37]
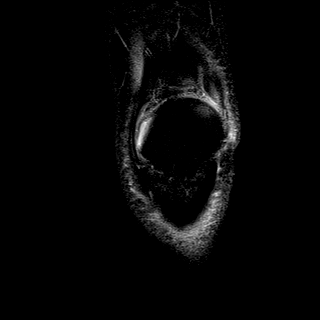
[im 37/37]
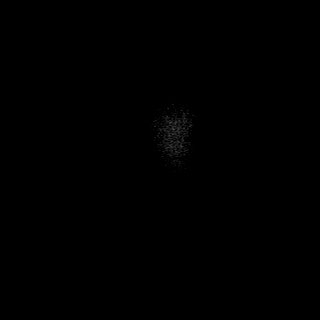

[Series 7: PD fat-sat · sagittal · 3.0mm · 0.50mm/px · 7 of 34 slices shown (3 of 4)]
[im 1/34]
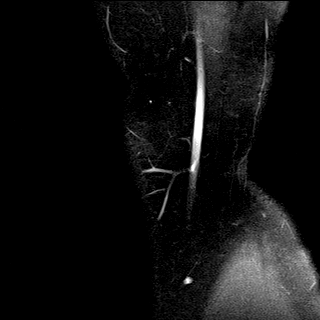
[im 6/34]
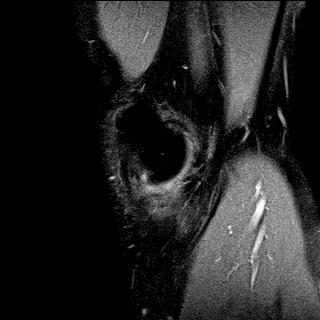
[im 12/34]
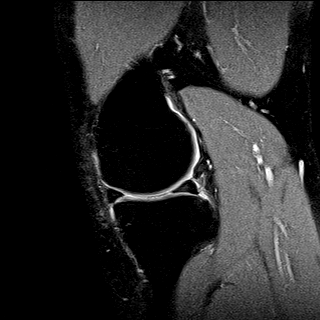
[im 17/34]
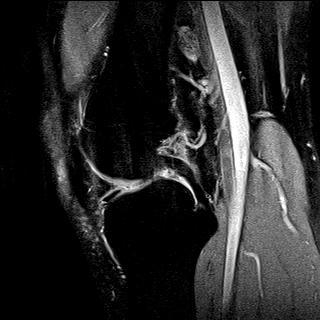
[im 23/34]
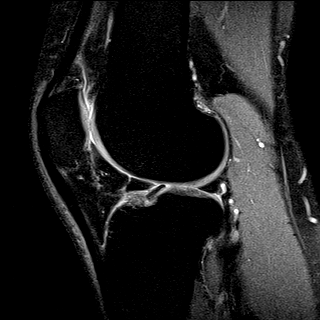
[im 28/34]
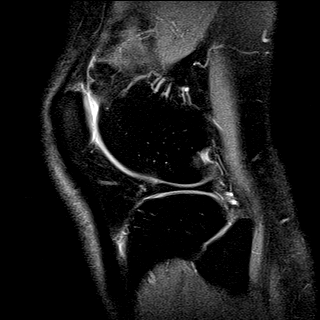
[im 34/34]
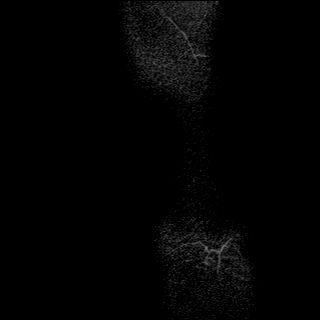

[Series 8: PD fat-sat · oblique · 2.0mm · 0.62mm/px · 3 of 13 slices shown (4 of 4)]
[im 1/13]
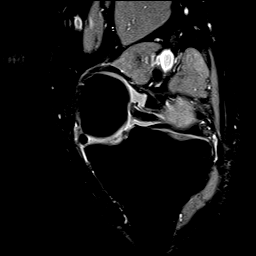
[im 7/13]
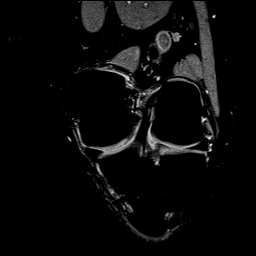
[im 13/13]
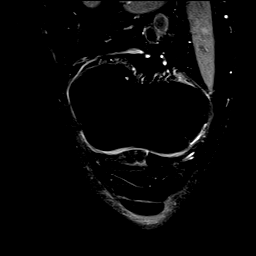

[36 of 40 positions shown; findings below may reference images not displayed]

FINDINGS: MENISCI

Medial meniscus: There is an undersurface horizontal tear of the
posterior horn and midbody also involving the free edge. The
undersurface component is flipped into the inferior gutter, best
seen on images 15 through 17 of series 6 there

Lateral meniscus:  Normal.

LIGAMENTS

Cruciates:  Normal.

Collaterals:  Normal.

CARTILAGE

Patellofemoral:  Grade 1 chondromalacia of the apex of the patella.

Medial:  Minimal chondromalacia of the medial compartment.

Lateral:  Normal.

Joint:  Normal.

Popliteal Fossa:  Normal.

Extensor Mechanism:  Normal.

Bones:  Normal.

Other: None
IMPRESSION: Horizontal tear of the midbody and posterior horn of medial
meniscus. The free edge of the midbody is also performed. The torn
components are flipped into the inferior gutter.
